# Patient Record
Sex: Male | Born: 1970 | Race: Black or African American | Hispanic: No | Marital: Single | Smoking: Current every day smoker
Health system: Southern US, Community
[De-identification: ages and names within clinical notes are randomized; demographics above are authoritative.]

## PROBLEM LIST (undated history)

## (undated) DIAGNOSIS — I10 Essential (primary) hypertension: Secondary | ICD-10-CM

## (undated) DIAGNOSIS — R569 Unspecified convulsions: Secondary | ICD-10-CM

## (undated) DIAGNOSIS — B20 Human immunodeficiency virus [HIV] disease: Secondary | ICD-10-CM

## (undated) DIAGNOSIS — Z21 Asymptomatic human immunodeficiency virus [HIV] infection status: Secondary | ICD-10-CM

## (undated) HISTORY — DX: Unspecified convulsions: R56.9

## (undated) HISTORY — DX: Essential (primary) hypertension: I10

## (undated) HISTORY — DX: Asymptomatic human immunodeficiency virus (hiv) infection status: Z21

## (undated) HISTORY — DX: Human immunodeficiency virus (HIV) disease: B20

---

## 2004-05-31 ENCOUNTER — Emergency Department: Payer: Self-pay | Admitting: Emergency Medicine

## 2004-06-01 ENCOUNTER — Emergency Department: Payer: Self-pay | Admitting: Unknown Physician Specialty

## 2005-11-13 ENCOUNTER — Other Ambulatory Visit: Payer: Self-pay

## 2005-11-14 ENCOUNTER — Observation Stay: Payer: Self-pay | Admitting: Otolaryngology

## 2005-11-21 ENCOUNTER — Emergency Department: Payer: Self-pay | Admitting: Emergency Medicine

## 2005-11-24 ENCOUNTER — Other Ambulatory Visit: Payer: Self-pay

## 2005-11-25 ENCOUNTER — Inpatient Hospital Stay: Payer: Self-pay | Admitting: Internal Medicine

## 2006-04-23 ENCOUNTER — Emergency Department: Payer: Self-pay | Admitting: Internal Medicine

## 2006-05-11 ENCOUNTER — Inpatient Hospital Stay: Payer: Self-pay | Admitting: Internal Medicine

## 2006-06-26 ENCOUNTER — Emergency Department: Payer: Self-pay | Admitting: Emergency Medicine

## 2006-07-11 ENCOUNTER — Inpatient Hospital Stay: Payer: Self-pay | Admitting: Internal Medicine

## 2006-08-29 DIAGNOSIS — I639 Cerebral infarction, unspecified: Secondary | ICD-10-CM

## 2006-08-29 HISTORY — DX: Cerebral infarction, unspecified: I63.9

## 2008-11-11 ENCOUNTER — Emergency Department: Payer: Self-pay | Admitting: Emergency Medicine

## 2009-04-12 ENCOUNTER — Emergency Department: Payer: Self-pay | Admitting: Internal Medicine

## 2009-04-16 ENCOUNTER — Emergency Department: Payer: Self-pay | Admitting: Emergency Medicine

## 2009-04-27 ENCOUNTER — Emergency Department: Payer: Self-pay | Admitting: Emergency Medicine

## 2012-02-10 ENCOUNTER — Emergency Department: Payer: Self-pay | Admitting: Emergency Medicine

## 2012-02-13 ENCOUNTER — Emergency Department: Payer: Self-pay | Admitting: *Deleted

## 2012-02-21 ENCOUNTER — Emergency Department: Payer: Self-pay | Admitting: Emergency Medicine

## 2013-01-09 ENCOUNTER — Emergency Department: Payer: Self-pay | Admitting: Internal Medicine

## 2013-04-23 DIAGNOSIS — Z21 Asymptomatic human immunodeficiency virus [HIV] infection status: Secondary | ICD-10-CM | POA: Insufficient documentation

## 2014-02-19 DIAGNOSIS — M8700 Idiopathic aseptic necrosis of unspecified bone: Secondary | ICD-10-CM | POA: Insufficient documentation

## 2014-03-08 IMAGING — CR DG KNEE 1-2V*L*
1 series · 1 of 1 positions shown · non-contrast
Comparison: none

REASON FOR EXAM: pain, s/p fall
COMMENTS:   LMP: (Male)

PROCEDURE:     DXR - DXR KNEE LEFT AP AND LATERAL  - January 09, 2013  [DATE]
RESULT:     There is no evidence of fracture, dislocation, or malalignment.

[lat]
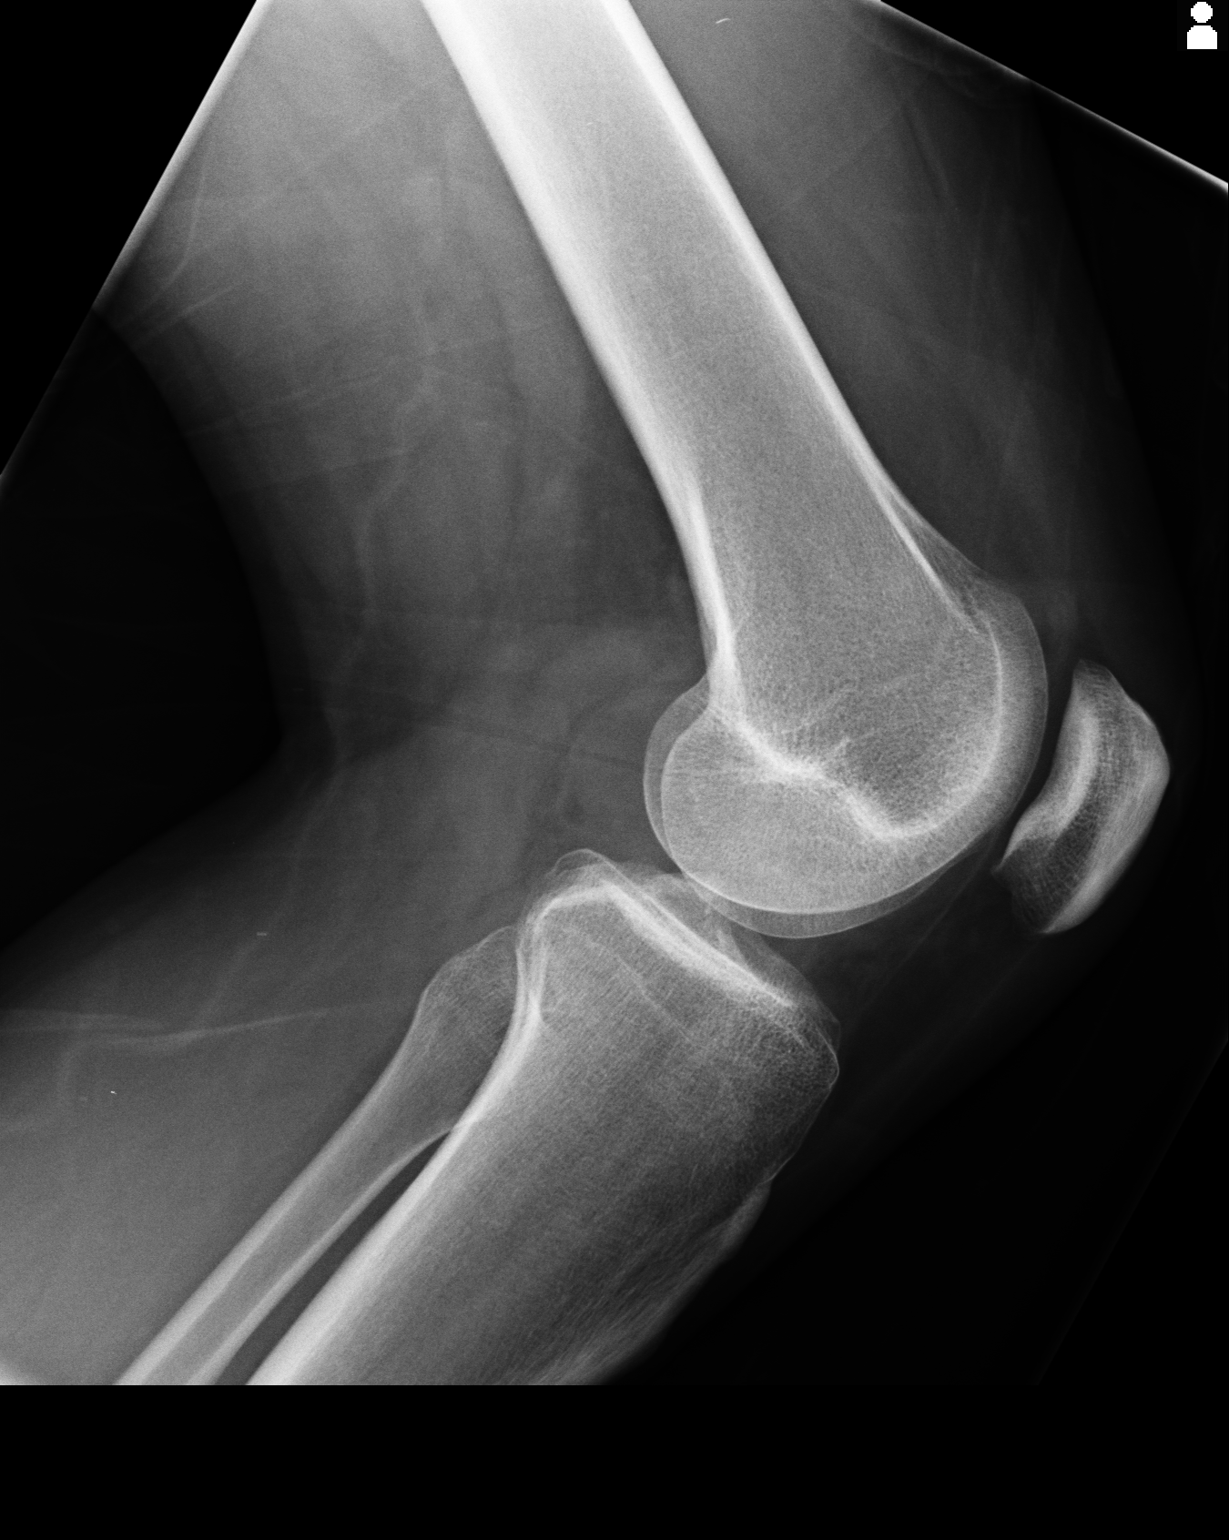

[1 of 1 positions shown; findings below may reference images not displayed]

IMPRESSION: 1. No evidence of acute abnormalities.
2. If there are persistent complaints of pain or persistent clinical
concern, a repeat evaluation in 7-10 days is recommended if clinically
warranted.

## 2014-11-06 DIAGNOSIS — Z96642 Presence of left artificial hip joint: Secondary | ICD-10-CM | POA: Insufficient documentation

## 2014-11-08 DIAGNOSIS — R6510 Systemic inflammatory response syndrome (SIRS) of non-infectious origin without acute organ dysfunction: Secondary | ICD-10-CM | POA: Insufficient documentation

## 2014-11-08 DIAGNOSIS — J9601 Acute respiratory failure with hypoxia: Secondary | ICD-10-CM | POA: Insufficient documentation

## 2014-11-08 DIAGNOSIS — R5082 Postprocedural fever: Secondary | ICD-10-CM | POA: Insufficient documentation

## 2014-11-28 HISTORY — PX: JOINT REPLACEMENT: SHX530

## 2019-07-18 ENCOUNTER — Encounter: Payer: Self-pay | Admitting: Infectious Diseases

## 2019-07-18 ENCOUNTER — Ambulatory Visit: Payer: Medicaid Other | Attending: Infectious Diseases | Admitting: Infectious Diseases

## 2019-07-18 ENCOUNTER — Other Ambulatory Visit: Payer: Self-pay

## 2019-07-18 VITALS — BP 124/80 | HR 78 | Temp 98.6°F | Resp 16 | Ht 71.0 in | Wt 223.0 lb

## 2019-07-18 DIAGNOSIS — B20 Human immunodeficiency virus [HIV] disease: Secondary | ICD-10-CM

## 2019-07-18 NOTE — Progress Notes (Signed)
Pt came to my office -new patient to engage in HIV care- in [facility] for [facility]- no insurance- called [facility] and spoke to [place]- appointment made for tomorrow at the community center. He will get Biktarvy there-  His appt with me is canceled as he has no insurance Spoke to the patient

## 2019-09-12 ENCOUNTER — Ambulatory Visit: Payer: Self-pay | Admitting: Gerontology

## 2020-01-28 ENCOUNTER — Ambulatory Visit: Payer: Medicaid Other

## 2020-04-13 ENCOUNTER — Ambulatory Visit: Payer: Medicaid Other | Attending: Internal Medicine

## 2020-04-13 DIAGNOSIS — Z23 Encounter for immunization: Secondary | ICD-10-CM

## 2020-04-13 NOTE — Progress Notes (Signed)
   Covid-19 Vaccination Clinic  Name:  Kingston Shawgo    MRN: [id number] DOB: 1970-10-09  04/13/2020  Mr. Cimini was observed post Covid-19 immunization for 15 minutes without incident. He was provided with Vaccine Information Sheet and instruction to access the V-Safe system.   Mr. Smethers was instructed to call 911 with any severe reactions post vaccine: Marland Kitchen Difficulty breathing  . Swelling of face and throat  . A fast heartbeat  . A bad rash all over body  . Dizziness and weakness   Immunizations Administered    Name Date Dose VIS Date Route   Moderna COVID-19 Vaccine 04/13/2020  4:04 PM 0.5 mL 07/2019 Intramuscular   Manufacturer: Moderna   Lot: [id number]   NDC: 94765-465-03

## 2020-05-11 ENCOUNTER — Ambulatory Visit: Payer: Medicaid Other

## 2021-09-16 ENCOUNTER — Other Ambulatory Visit: Payer: Self-pay

## 2021-09-16 ENCOUNTER — Emergency Department: Payer: Medicaid Other

## 2021-09-16 ENCOUNTER — Emergency Department
Admission: EM | Admit: 2021-09-16 | Discharge: 2021-09-16 | Disposition: A | Discharge status: Home / Self Care | Payer: Medicaid Other | Attending: Emergency Medicine | Admitting: Emergency Medicine

## 2021-09-16 DIAGNOSIS — M7989 Other specified soft tissue disorders: Secondary | ICD-10-CM

## 2021-09-16 DIAGNOSIS — B029 Zoster without complications: Secondary | ICD-10-CM | POA: Insufficient documentation

## 2021-09-16 MED ORDER — HYDROCODONE-ACETAMINOPHEN 5-325 MG PO TABS
1.0000 | ORAL_TABLET | Freq: Once | ORAL | Status: AC
  Administered 2021-09-16: 1 via ORAL
  Filled 2021-09-16: qty 1

## 2021-09-16 MED ORDER — VALACYCLOVIR HCL 500 MG PO TABS
1000.0000 mg | ORAL_TABLET | Freq: Once | ORAL | Status: AC
  Administered 2021-09-16: 1000 mg via ORAL
  Filled 2021-09-16: qty 2

## 2021-09-16 MED ORDER — IBUPROFEN 600 MG PO TABS: 600.0000 mg | ORAL_TABLET | Freq: Three times a day (TID) | ORAL | 0 refills | Status: AC | PRN

## 2021-09-16 MED ORDER — VALACYCLOVIR HCL 500 MG PO TABS: 1000.0000 mg | ORAL_TABLET | Freq: Three times a day (TID) | ORAL | 0 refills | Status: AC

## 2021-09-16 MED ORDER — HYDROCODONE-ACETAMINOPHEN 5-325 MG PO TABS: 1.0000 | ORAL_TABLET | ORAL | 0 refills | Status: AC | PRN

## 2021-09-16 NOTE — ED Notes (Signed)
Pt attempted to sign for d/c but signature pad did not work.  

## 2021-09-16 NOTE — ED Triage Notes (Signed)
Pt c/o left leg swelling and pain from the hip down for the past couple of days and is concerned about a clot

## 2021-09-16 NOTE — ED Provider Notes (Signed)
[facility] Provider Note    Event Date/Time   First MD Initiated Contact with Patient 09/16/21 [phone]     (approximate)   History   Leg Swelling  HPI Marcus Oconnell is a 51 y.o. male with a stated past medical history of HIV who presents for rash, inflammation, and pain to the left medial thigh for the last 2 days.  Patient states that this rash has been worsening since onset and now is extremely painful at 10/10 sharp/burning pain that does not radiate and is worse with palpation.  Patient denies any relieving factors including over-the-counter NSAIDs.  Patient denies missing any doses of his haart.     Physical Exam   Triage Vital Signs: ED Triage Vitals  Enc Vitals Group     BP 09/16/21 0833 (!) 159/102     Pulse Rate 09/16/21 0833 (!) 106     Resp 09/16/21 0900 18     Temp 09/16/21 0833 98.7 F (37.1 C)     Temp Source 09/16/21 0833 Oral     SpO2 09/16/21 0833 99 %     Weight --      Height --      Head Circumference --      Peak Flow --      Pain Score --      Pain Loc --      Pain Edu? --      Excl. in [place]? --     Most recent vital signs: Vitals:   09/16/21 0833 09/16/21 0900  BP: (!) 159/102 (!) 165/88  Pulse: (!) 106 97  Resp:  18  Temp: 98.7 F (37.1 C)   SpO2: 99% 100%    General: Awake, no distress.  CV:  Good peripheral perfusion.  Resp:  Normal effort.  Abd:  No distention.  Other:  Scattered areas along the left anterior and medial thigh with a papular erythematous rash on an erythematous base with varying levels of excoriation and eschar   ED Results / Procedures / Treatments   Labs (all labs ordered are listed, but only abnormal results are displayed) Labs Reviewed - No data to display  RADIOLOGY ED MD interpretation: Venous Doppler of the left lower extremity shows no evidence of DVT.  Agree with radiology assessment  Official radiology report(s): US Venous Img Lower Unilateral Left (DVT)  Result Date:  09/16/2021 CLINICAL DATA:  51 year old male with lower extremity pain, swelling, redness, rash. EXAM: LEFT LOWER EXTREMITY VENOUS DOPPLER ULTRASOUND TECHNIQUE: Gray-scale sonography with compression, as well as color and duplex ultrasound, were performed to evaluate the deep venous system(s) from the level of the common femoral vein through the popliteal and proximal calf veins. COMPARISON:  None. FINDINGS: VENOUS Normal compressibility of the common femoral, superficial femoral, and popliteal veins, as well as the visualized calf veins. Visualized portions of profunda femoral vein and great saphenous vein unremarkable. No filling defects to suggest DVT on grayscale or color Doppler imaging. Doppler waveforms show normal direction of venous flow, normal respiratory plasticity and response to augmentation. Limited views of the contralateral common femoral vein are unremarkable. OTHER Asymmetric but normal size left inguinal lymph nodes with fatty hila. Limitations: none IMPRESSION: No evidence of left lower extremity deep venous thrombosis. Electronically Signed   By: Genevie Ann M.D.   On: 09/16/2021 09:24      PROCEDURES:  Critical Care performed: No  Procedures   MEDICATIONS ORDERED IN ED: Medications  HYDROcodone-acetaminophen (NORCO/VICODIN) 5-325 MG per tablet  1 tablet (has no administration in time range)  valACYclovir (VALTREX) tablet 1,000 mg (has no administration in time range)     IMPRESSION / MDM / ASSESSMENT AND PLAN / ED COURSE  I reviewed the triage vital signs and the nursing notes.                              Differential diagnosis includes, but is not limited to, DVT, arterial occlusion, herpes zoster  The patient is on the cardiac monitor to evaluate for evidence of arrhythmia and/or significant heart rate changes.  Patient a 51 year old male with a stated past medical history of HIV on Elnoria Howard therapy who presents for a rash to the left thigh consistent with herpes zoster  infection.  Patient's Dopplers not show any evidence of DVT.  As patient is less than 72 hours from onset of symptoms, will treat with valacyclovir as well as short course of opiate analgesia.  The patient has been reexamined and is ready to be discharged.  All diagnostic results have been reviewed and discussed with the patient/family.  Care plan has been outlined and the patient/family understands all current diagnoses, results, and treatment plans.  There are no new complaints, changes, or physical findings at this time.  All questions have been addressed and answered.  All medications, if any, that were given while in the emergency department or any that are being prescribed have been reviewed with the patient/family.  All side effects and adverse reactions have been explained.  Patient was instructed to, and agrees to follow-up with their primary care physician as well as return to the emergency department if any new or worsening symptoms develop.  Dispo: Discharge home      FINAL CLINICAL IMPRESSION(S) / ED DIAGNOSES   Final diagnoses:  Thigh shingles     Rx / DC Orders   ED Discharge Orders          Ordered    valACYclovir (VALTREX) 500 MG tablet  3 times daily        09/16/21 1009    HYDROcodone-acetaminophen (NORCO) 5-325 MG tablet  Every 4 hours PRN        09/16/21 1009    ibuprofen (ADVIL) 600 MG tablet  Every 8 hours PRN        09/16/21 1009             Note:  This document was prepared using Dragon voice recognition software and may include unintentional dictation errors.   Naaman Plummer, MD 09/16/21 [phone]

## 2022-11-13 IMAGING — US US EXTREM LOW VENOUS*L*
1 series · 14 of 24 positions shown · non-contrast
Comparison: None.

CLINICAL DATA: 50-year-old male with lower extremity pain,
swelling, redness, rash.

EXAM:
LEFT LOWER EXTREMITY VENOUS DOPPLER ULTRASOUND
TECHNIQUE: Gray-scale sonography with compression, as well as color and duplex
ultrasound, were performed to evaluate the deep venous system(s)
from the level of the common femoral vein through the popliteal and
proximal calf veins.

[Series 1: us venous img lower uni left (dvt) · portal-venous · 14 of 39 slices shown]
[im 1/39]
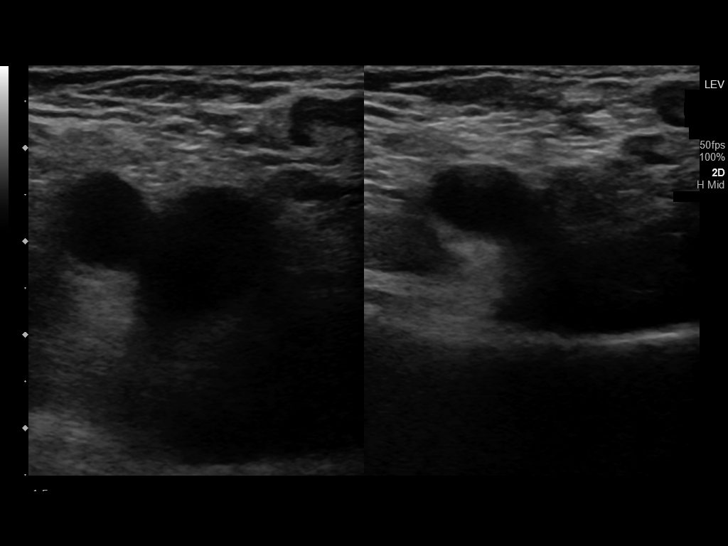
[im 4/39]
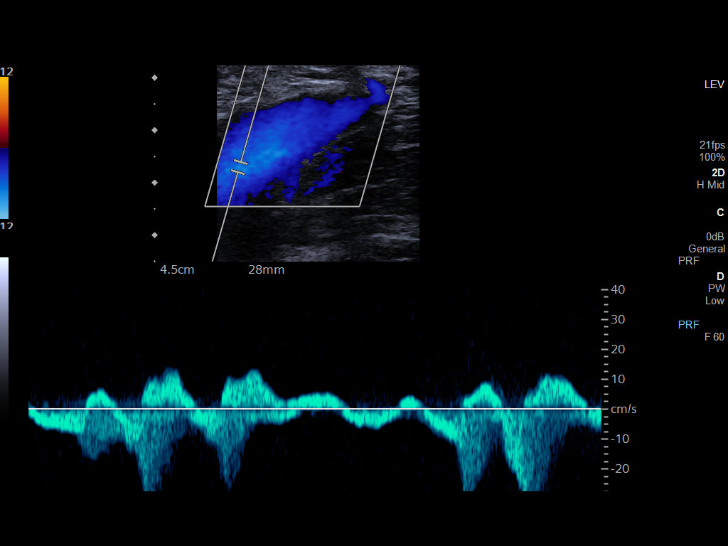
[im 7/39]
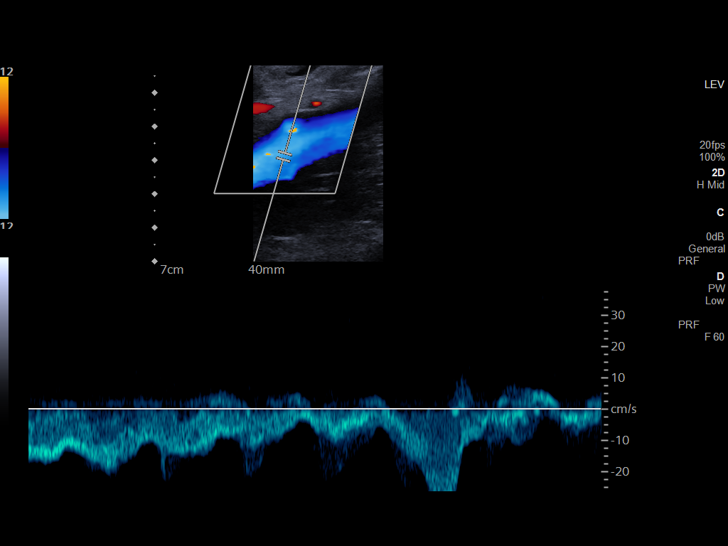
[im 10/39]
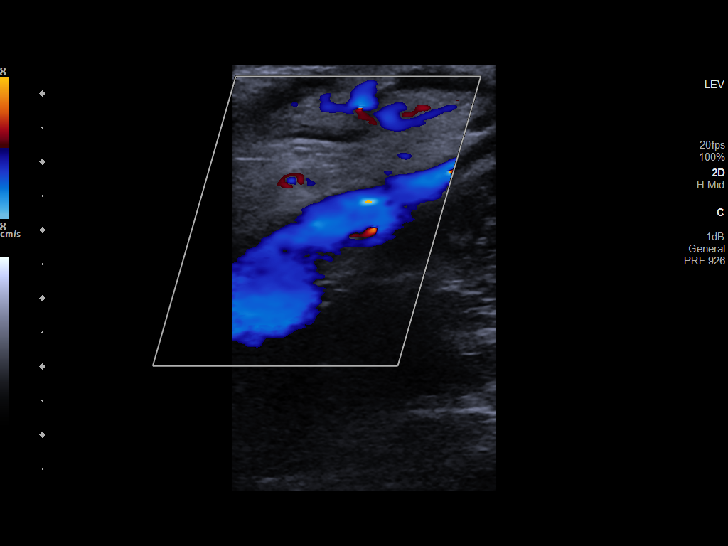
[im 12/39]
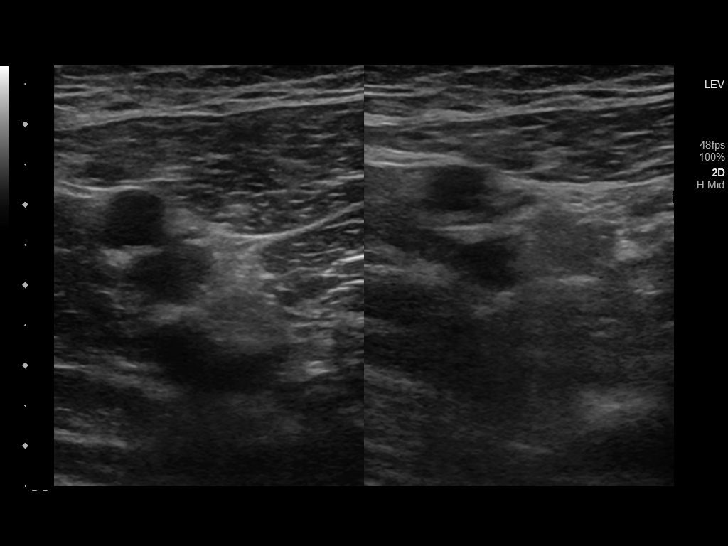
[im 15/39]
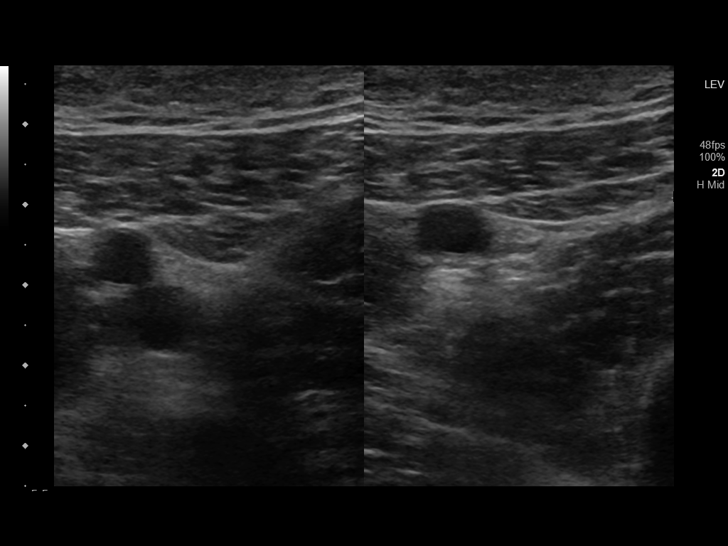
[im 19/39]
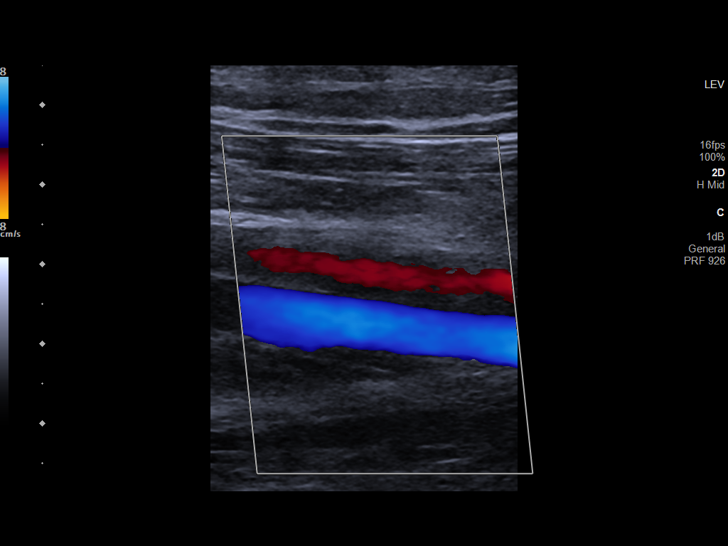
[im 20/39]
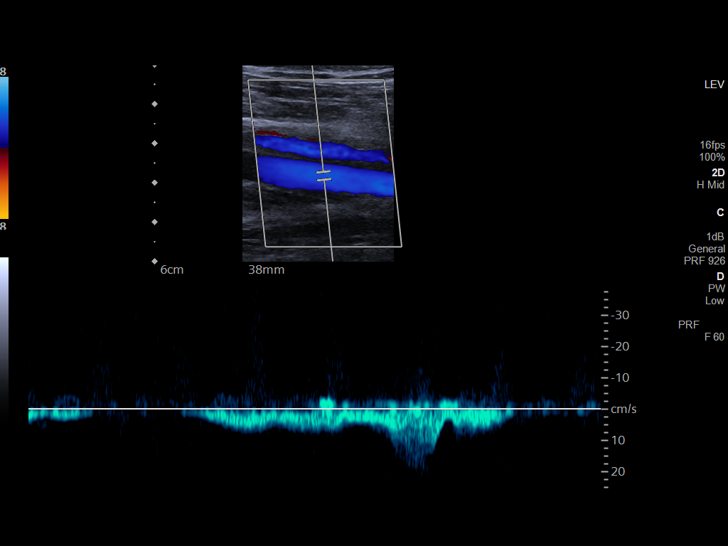
[im 24/39]
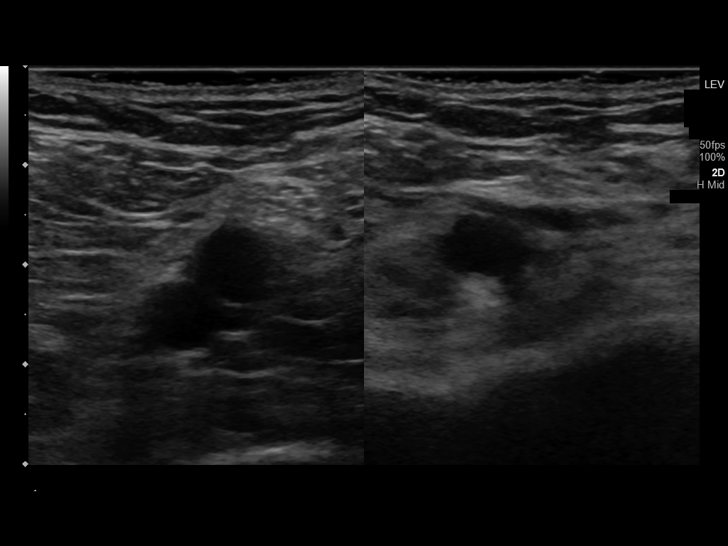
[im 27/39]
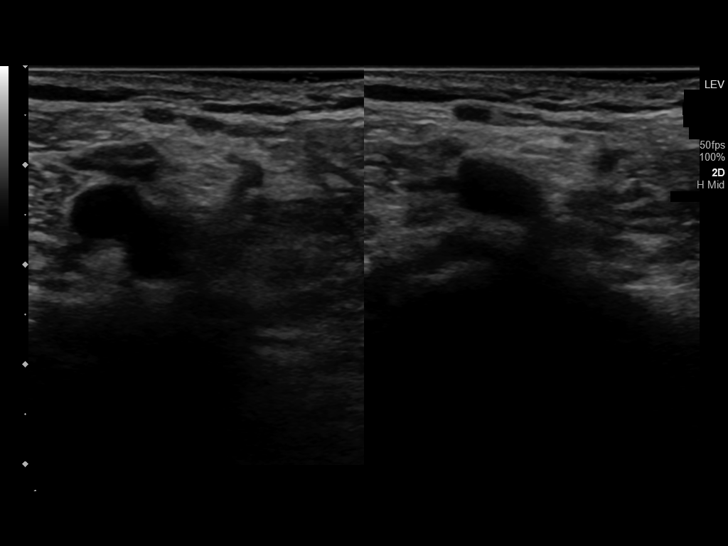
[im 30/39]
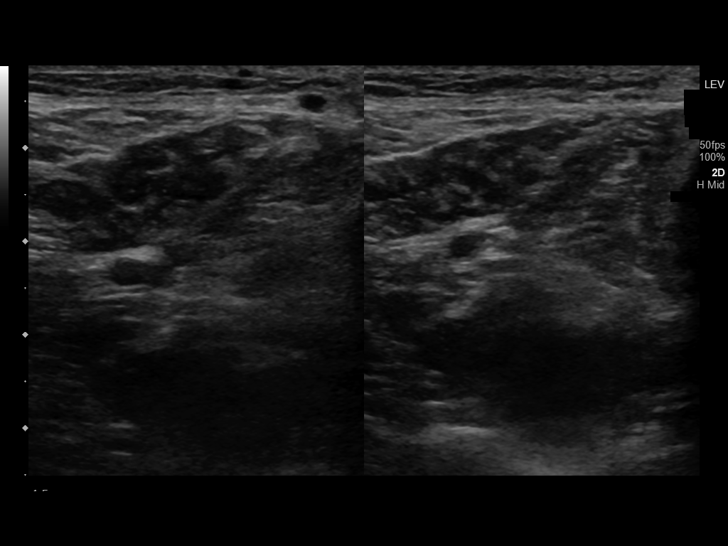
[im 32/39]
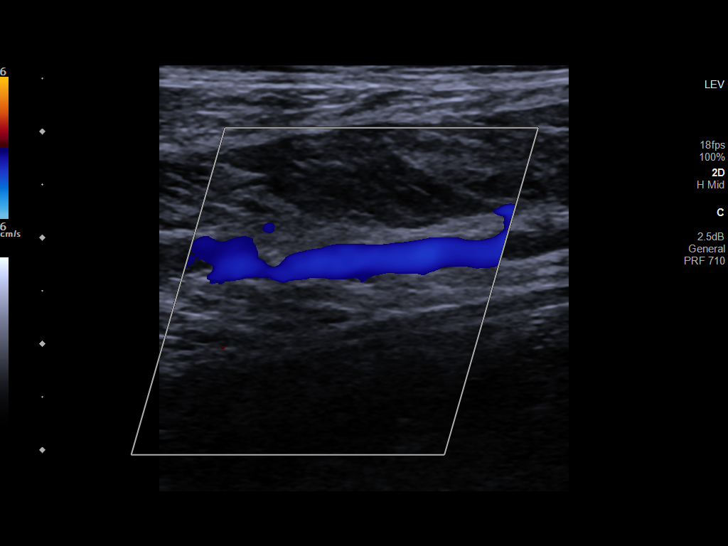
[im 35/39]
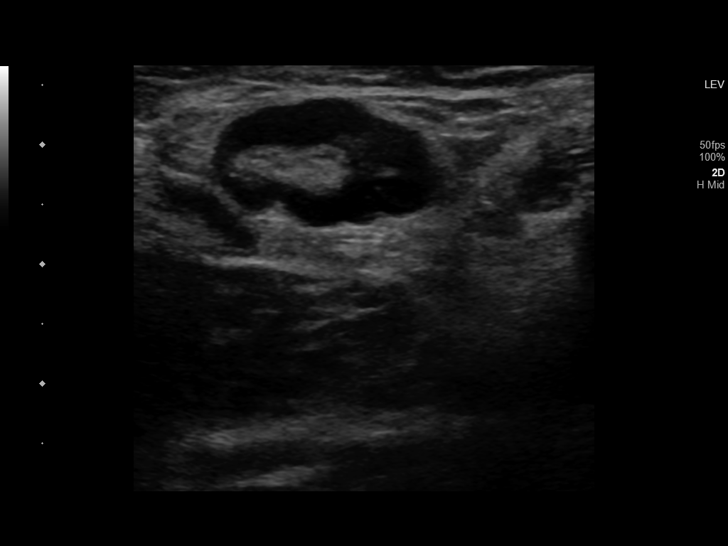
[im 39/39]
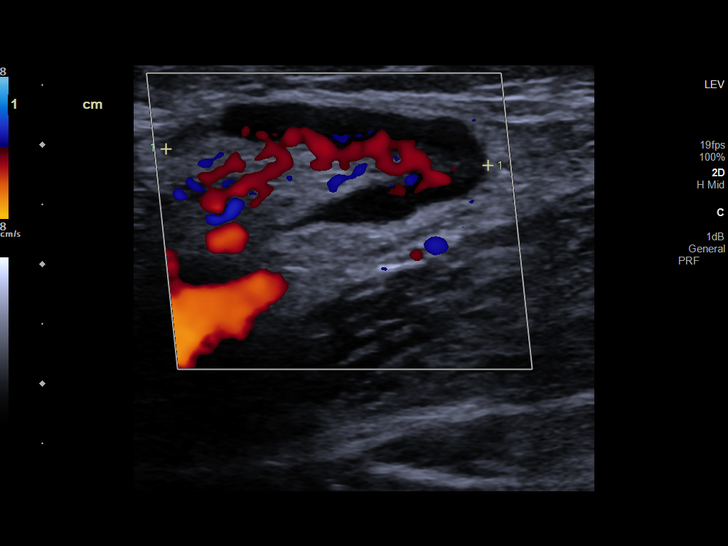

[14 of 24 positions shown; findings below may reference images not displayed]

FINDINGS: VENOUS

Normal compressibility of the common femoral, superficial femoral,
and popliteal veins, as well as the visualized calf veins.
Visualized portions of profunda femoral vein and great saphenous
vein unremarkable. No filling defects to suggest DVT on grayscale or
color Doppler imaging. Doppler waveforms show normal direction of
venous flow, normal respiratory plasticity and response to
augmentation.

Limited views of the contralateral common femoral vein are
unremarkable.

OTHER

Asymmetric but normal size left inguinal lymph nodes with fatty
hila.

Limitations: none
IMPRESSION: No evidence of left lower extremity deep venous thrombosis.

## 2023-11-01 ENCOUNTER — Other Ambulatory Visit: Payer: Self-pay | Admitting: Nurse Practitioner

## 2023-11-01 DIAGNOSIS — B20 Human immunodeficiency virus [HIV] disease: Secondary | ICD-10-CM

## 2023-11-01 DIAGNOSIS — M25551 Pain in right hip: Secondary | ICD-10-CM

## 2023-11-09 ENCOUNTER — Other Ambulatory Visit

## 2023-11-10 ENCOUNTER — Inpatient Hospital Stay: Admission: RE | Admit: 2023-11-10 | Source: Ambulatory Visit

## 2024-07-17 ENCOUNTER — Other Ambulatory Visit: Payer: Self-pay

## 2024-07-17 ENCOUNTER — Emergency Department
Admission: EM | Admit: 2024-07-17 | Discharge: 2024-07-17 | Disposition: A | Discharge status: Home / Self Care | Attending: Emergency Medicine | Admitting: Emergency Medicine

## 2024-07-17 DIAGNOSIS — L03011 Cellulitis of right finger: Secondary | ICD-10-CM | POA: Insufficient documentation

## 2024-07-17 DIAGNOSIS — M7989 Other specified soft tissue disorders: Secondary | ICD-10-CM | POA: Diagnosis present

## 2024-07-17 DIAGNOSIS — I10 Essential (primary) hypertension: Secondary | ICD-10-CM | POA: Diagnosis not present

## 2024-07-17 DIAGNOSIS — Z21 Asymptomatic human immunodeficiency virus [HIV] infection status: Secondary | ICD-10-CM | POA: Insufficient documentation

## 2024-07-17 MED ORDER — TETANUS-DIPHTH-ACELL PERTUSSIS 5-2-15.5 LF-MCG/0.5 IM SUSP
0.5000 mL | Freq: Once | INTRAMUSCULAR | Status: AC
  Administered 2024-07-17: 0.5 mL via INTRAMUSCULAR
  Filled 2024-07-17: qty 0.5

## 2024-07-17 MED ORDER — CEPHALEXIN 500 MG PO CAPS
500.0000 mg | ORAL_CAPSULE | Freq: Four times a day (QID) | ORAL | 0 refills | Status: AC
Start: 2024-07-17 — End: 2024-07-24

## 2024-07-17 MED ORDER — LIDOCAINE HCL (PF) 1 % IJ SOLN
10.0000 mL | Freq: Once | INTRAMUSCULAR | Status: AC
Start: 2024-07-17 — End: 2024-07-17
  Administered 2024-07-17: 10 mL
  Filled 2024-07-17: qty 10

## 2024-07-17 MED ORDER — CEPHALEXIN 500 MG PO CAPS
500.0000 mg | ORAL_CAPSULE | Freq: Once | ORAL | Status: AC
  Administered 2024-07-17: 500 mg via ORAL
  Filled 2024-07-17: qty 1

## 2024-07-17 NOTE — ED Triage Notes (Signed)
 Pt arrives via POV with c/o swelling to their right pinkie. Pt denies any trauma, bug bites, etc. Pt is able to move the appendage. Pt is A&Ox4 and ambulatory during triage.

## 2024-07-17 NOTE — Discharge Instructions (Addendum)
 You were seen in the emergency department for a paronychia which is an infection of your finger.  Please pick up and take antibiotics as prescribed.  You may do warm water soaks at home.  You may take the dressing off after 24 hours and wash your hands with soap and water and shower as you normally would, then reapply new dressing.  Please follow-up with the [place] wound [facility] center in 24 to 48 hours for recheck of your wound.  You may take Tylenol  or ibuprofen  at home for your pain based on the instructions on the bottle.  Return to the emergency department if you experience fever, chills, excessive pus-like drainage, worsening redness, swelling, or intolerable pain of your finger.  I have also placed a referral to establish care with primary care for you.  Please look through the list of resources and give their office a call at your earliest convenience.

## 2024-07-17 NOTE — ED Provider Notes (Signed)
 [facility] Provider Note    Event Date/Time   First MD Initiated Contact with Patient 07/17/24 1707     (approximate)   History   Abscess   HPI  Marcus Oconnell is a 53 y.o. male  with a past medical history of AVN of left hip, HIV, CVA, Seizures, HTN presents to the emergency department with swelling and redness to his right pinky just proximal to the nail.  Patient states this has been present for about 2 days.  It is painful to the touch.  Patient denies fever, chills, puslike drainage, insect bite.  Reports he has been able to move the joint.  Denies any fall or injury.  Unsure of last tetanus shot.   Physical Exam   Triage Vital Signs: ED Triage Vitals  Encounter Vitals Group     BP 07/17/24 1619 (!) 136/92     Girls Systolic BP Percentile --      Girls Diastolic BP Percentile --      Boys Systolic BP Percentile --      Boys Diastolic BP Percentile --      Pulse Rate 07/17/24 1619 79     Resp 07/17/24 1619 15     Temp 07/17/24 1619 98.4 F (36.9 C)     Temp Source 07/17/24 1619 Oral     SpO2 07/17/24 1619 100 %     Weight 07/17/24 1626 200 lb (90.7 kg)     Height 07/17/24 1626 5' 11 (1.803 m)     Head Circumference --      Peak Flow --      Pain Score 07/17/24 1625 7     Pain Loc --      Pain Education --      Exclude from Growth Chart --     Most recent vital signs: Vitals:   07/17/24 1619 07/17/24 1737  BP: (!) 136/92   Pulse: 79   Resp: 15   Temp: 98.4 F (36.9 C)   SpO2: 100% 100%    General: Awake, in no acute distress.  CV: Good peripheral perfusion. Radial pulses 2+ b/l. Cap refill <2 sec. Respiratory:Normal respiratory effort.  No respiratory distress.  GI: Soft, non-distended. MSK: Normal ROM and 5/5 strength in all b/l finger joints. Skin:Warm, dry, small fluctuance noted just distal to the nailbed of right pinky finger with surrounding erythema. No active drainage. Neurological: A&Ox4 to person, place, time, and  situation. Sensation intact and equal to b/l fingers. Strength symmetric.    ED Results / Procedures / Treatments   Labs (all labs ordered are listed, but only abnormal results are displayed) Labs Reviewed - No data to display   EKG     RADIOLOGY    PROCEDURES:  Critical Care performed: No   .Incision and Drainage  Date/Time: 07/17/2024 6:18 PM  Performed by: Sheron Salm, PA-C Authorized by: Sheron Salm, PA-C   Consent:    Consent obtained:  Verbal   Consent given by:  Patient   Risks, benefits, and alternatives were discussed: yes     Risks discussed:  Bleeding, damage to other organs, infection, incomplete drainage and pain Universal protocol:    Procedure explained and questions answered to patient or proxy's satisfaction: yes     Immediately prior to procedure, a time out was called: yes     Patient identity confirmed:  Verbally with patient Location:    Indications for incision and drainage: paronychia.   Size:  0.5x1 cm  Location: right pinky finger. Pre-procedure details:    Skin preparation:  Povidone-iodine Sedation:    Sedation type:  None Anesthesia:    Anesthesia method:  Local infiltration   Local anesthetic:  Lidocaine  1% w/o epi Procedure type:    Complexity:  Simple Procedure details:    Incision types:  Single straight   Incision depth:  Dermal   Wound management:  Irrigated with saline   Drainage:  Purulent   Drainage amount:  Moderate   Packing materials:  None Post-procedure details:    Procedure completion:  Tolerated well, no immediate complications     MEDICATIONS ORDERED IN ED: Medications  lidocaine  (PF) (XYLOCAINE ) 1 % injection 10 mL (10 mLs Infiltration Given 07/17/24 1743)  Tdap (ADACEL ) injection 0.5 mL (0.5 mLs Intramuscular Given 07/17/24 1743)  cephALEXin  (KEFLEX ) capsule 500 mg (500 mg Oral Given 07/17/24 1818)     IMPRESSION / MDM / ASSESSMENT AND PLAN / ED COURSE  I reviewed the triage vital signs and  the nursing notes.                              Differential diagnosis includes, but is not limited to, paronychia, felon, nerve or tendon injury, laceration, abrasion  Patient's presentation is most consistent with acute, uncomplicated illness.  Patient here with paronychia of the right pinky finger as described in physical exam above.  Discussed I&D with the patient.  He was agreeable to the plan.  Please see procedure note above for I&D procedure details after anesthesia achieved with lidocaine  1% without epi.  Sterile dressing applied.  Wound care instructions discussed with the patient.  Patient has history of HIV, so we will provide him with prophylactic antibiotics.  Given a dose of Keflex  here and will send prescription to his pharmacy.  He may take ibuprofen  or Tylenol  as needed for pain based on instructions on the bottle.  Will have him follow-up with the local wound care center in 24 to 48 hours for recheck of his finger.  Also placed a referral to to establish care with primary care.  The patient may return to the emergency department for any new, worsening, or concerning symptoms. Patient was given the opportunity to ask questions; all questions were answered. Emergency department return precautions were discussed with the patient.  Patient is in agreement to the treatment plan.  Patient is stable for discharge.    FINAL CLINICAL IMPRESSION(S) / ED DIAGNOSES   Final diagnoses:  Paronychia of finger of right hand     Rx / DC Orders   ED Discharge Orders          Ordered    Ambulatory Referral to Primary Care (Establish Care)  Status:  Canceled        07/17/24 1814    cephALEXin  (KEFLEX ) 500 MG capsule  4 times daily        07/17/24 1814    Ambulatory Referral to Primary Care (Establish Care)        07/17/24 1816             Note:  This document was prepared using Dragon voice recognition software and may include unintentional dictation errors.     Sheron Salm,  PA-C 07/17/24 TRENNA Suzanne Kirsch, MD 07/19/24 [phone]

## 2024-07-17 NOTE — ED Provider Triage Note (Addendum)
 Emergency Medicine Provider Triage Evaluation Note  Marcus Oconnell , a 54 y.o. male  was evaluated in triage.  Pt complains of right pinky finger swelling x 2 days Denies any fall or trauma, fever, chills, pus-like drainage.   Physical Exam  BP (!) 136/92 (BP Location: Left Arm)   Pulse 79   Temp 98.4 F (36.9 C) (Oral)   Resp 15   SpO2 100%  Gen:   Awake, no distress   Resp:  Normal effort  MSK:   Moves extremities without difficulty  Other:  Full ROM of all joints of right pinky. Fluctuance noted just proximal to the nailbed w/ mild surrounding erythema. Radial pulses 2+ b/l. Cap refill <2 sec.  Medical Decision Making  Medically screening exam initiated at 4:25 PM.  Appropriate orders placed.  Marcus Oconnell was informed that the remainder of the evaluation will be completed by another provider, this initial triage assessment does not replace that evaluation, and the importance of remaining in the ED until their evaluation is complete.  Patient with paronychia that needs to be drained. No orders at this time.   Sheron Salm, PA-C 07/17/24 1627    Sheron Mitchell, [place] 07/17/24 [phone]
